# Patient Record
Sex: Male | Born: 1990 | Race: Black or African American | Hispanic: No | Marital: Single
Health system: Southern US, Community
[De-identification: ages and names within clinical notes are randomized; demographics above are authoritative.]

---
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
## 2017-08-08 ENCOUNTER — Other Ambulatory Visit: Payer: Self-pay

## 2017-08-08 ENCOUNTER — Emergency Department
Admission: EM | Admit: 2017-08-08 | Discharge: 2017-08-09 | Disposition: A | Discharge status: Home / Self Care | Payer: Self-pay | Attending: Emergency Medicine | Admitting: Emergency Medicine

## 2017-08-08 ENCOUNTER — Emergency Department: Payer: Self-pay

## 2017-08-08 ENCOUNTER — Encounter: Payer: Self-pay | Admitting: Emergency Medicine

## 2017-08-08 DIAGNOSIS — R112 Nausea with vomiting, unspecified: Secondary | ICD-10-CM

## 2017-08-08 DIAGNOSIS — R1084 Generalized abdominal pain: Secondary | ICD-10-CM

## 2017-08-08 DIAGNOSIS — Z79899 Other long term (current) drug therapy: Secondary | ICD-10-CM | POA: Insufficient documentation

## 2017-08-08 DIAGNOSIS — K529 Noninfective gastroenteritis and colitis, unspecified: Secondary | ICD-10-CM | POA: Insufficient documentation

## 2017-08-08 DIAGNOSIS — F17200 Nicotine dependence, unspecified, uncomplicated: Secondary | ICD-10-CM | POA: Insufficient documentation

## 2017-08-08 DIAGNOSIS — R197 Diarrhea, unspecified: Secondary | ICD-10-CM

## 2017-08-08 MED ORDER — FAMOTIDINE IN NACL 20-0.9 MG/50ML-% IV SOLN
20.0000 mg | Freq: Once | INTRAVENOUS | Status: AC
  Administered 2017-08-08: 20 mg via INTRAVENOUS
  Filled 2017-08-08: qty 50

## 2017-08-08 MED ORDER — ONDANSETRON HCL 4 MG/2ML IJ SOLN
4.0000 mg | Freq: Once | INTRAMUSCULAR | Status: AC
  Administered 2017-08-08: 4 mg via INTRAVENOUS
  Filled 2017-08-08: qty 2

## 2017-08-08 MED ORDER — IOPAMIDOL (ISOVUE-300) INJECTION 61%
30.0000 mL | Freq: Once | INTRAVENOUS | Status: AC | PRN
  Administered 2017-08-08: 30 mL via ORAL

## 2017-08-08 MED ORDER — IOPAMIDOL (ISOVUE-370) INJECTION 76%
100.0000 mL | Freq: Once | INTRAVENOUS | Status: AC | PRN
  Administered 2017-08-09: 100 mL via INTRAVENOUS

## 2017-08-08 MED ORDER — SODIUM CHLORIDE 0.9 % IV BOLUS
1000.0000 mL | Freq: Once | INTRAVENOUS | Status: AC
  Administered 2017-08-08: 1000 mL via INTRAVENOUS

## 2017-08-08 NOTE — ED Triage Notes (Addendum)
Pt to triage via w/c with no distress noted, no eye contact; pt c/o generalized abd pain x 2 days; denies any accomp symptoms; pt is refusing to answer questions in triage and only does so after much prompting

## 2017-08-08 NOTE — ED Provider Notes (Signed)
[facility] Emergency Department Provider Note   ____________________________________________   First MD Initiated Contact with Patient 08/08/17 2314     (approximate)  I have reviewed the triage vital signs and the nursing notes.   HISTORY  Chief Complaint Abdominal Pain  Vague historian; history obtained by family members  HPI Nicholas T Justiniano is a 27 y.o. male who presents to the ED from home with a chief complaint of abdominal pain, nausea/vomiting/diarrhea.  Family reports a 2-day history of the above symptoms.  Patient complaining of generalized abdominal pain, constant, associated with vomiting and diarrhea.  Denies associated fever, chills, chest pain, shortness of breath, dysuria.  Family member has noted patient taking Mucinex for sinus drainage and dry cough.  Denies recent travel, trauma or antibiotic use.   Past medical history None  There are no active problems to display for this patient.   Past surgical history None  Prior to Admission medications   Medication Sig Start Date End Date Taking? Authorizing Provider  ciprofloxacin (CIPRO) 500 MG tablet Take 1 tablet (500 mg total) by mouth 2 (two) times daily. 08/09/17   Irean Hong, MD  metroNIDAZOLE (FLAGYL) 500 MG tablet Take 1 tablet (500 mg total) by mouth 2 (two) times daily. 08/09/17   Irean Hong, MD  ondansetron (ZOFRAN ODT) 4 MG disintegrating tablet Take 1 tablet (4 mg total) by mouth every 8 (eight) hours as needed for nausea or vomiting. 08/09/17   Irean Hong, MD  oxyCODONE-acetaminophen (PERCOCET/ROXICET) 5-325 MG tablet Take 1 tablet by mouth every 4 (four) hours as needed for severe pain. 08/09/17   Irean Hong, MD    Allergies Patient has no known allergies.  No family history on file.  Social History Social History   Tobacco Use  . Smoking status: Current Every Day Smoker  . Smokeless tobacco: Never Used  Substance Use Topics  . Alcohol use: Not Currently  . Drug  use: Not on file    Review of Systems  Constitutional: No fever/chills Eyes: No visual changes. ENT: No sore throat. Cardiovascular: Denies chest pain. Respiratory: Denies shortness of breath. Gastrointestinal: Positive for abdominal pain, nausea, vomiting and diarrhea.  No constipation. Genitourinary: Negative for dysuria. Musculoskeletal: Negative for back pain. Skin: Negative for rash. Neurological: Negative for headaches, focal weakness or numbness.   ____________________________________________   PHYSICAL EXAM:  VITAL SIGNS: ED Triage Vitals [08/08/17 2309]  Enc Vitals Group     BP 137/68     Pulse Rate 85     Resp 18     Temp 98 F (36.7 C)     Temp Source Oral     SpO2 98 %     Weight 220 lb (99.8 kg)     Height 6\' 2"  (1.88 m)     Head Circumference      Peak Flow      Pain Score 10     Pain Loc      Pain Edu?      Excl. in GC?     Constitutional: Alert and oriented. Well appearing and in no acute distress.  Refusing to speak.  Averts eye contact.  Answers some questions.  Otherwise family member does the majority of the talking. Eyes: Conjunctivae are normal. PERRL. EOMI. Head: Atraumatic. Nose: No congestion/rhinnorhea. Mouth/Throat: Mucous membranes are moist.  Oropharynx non-erythematous. Neck: No stridor.   Cardiovascular: Normal rate, regular rhythm. Grossly normal heart sounds.  Good peripheral circulation. Respiratory: Normal respiratory effort.  No retractions. Lungs CTAB. Gastrointestinal: Soft and mildly tender to palpation diffusely without rebound or guarding. No distention. No abdominal bruits. No CVA tenderness. Musculoskeletal: No lower extremity tenderness nor edema.  No joint effusions. Neurologic:  Normal speech and language. No gross focal neurologic deficits are appreciated. No gait instability. Skin:  Skin is warm, dry and intact. No rash noted. Psychiatric: Mood and affect are normal. Speech and behavior are  normal.  ____________________________________________   LABS (all labs ordered are listed, but only abnormal results are displayed)  Labs Reviewed  CBC WITH DIFFERENTIAL/PLATELET - Abnormal; Notable for the following components:      Result Value   Neutro Abs 6.9 (*)    All other components within normal limits  COMPREHENSIVE METABOLIC PANEL - Abnormal; Notable for the following components:   Total Protein 8.2 (*)    AST 89 (*)    ALT 181 (*)    Alkaline Phosphatase 181 (*)    All other components within normal limits  URINALYSIS, COMPLETE (UACMP) WITH MICROSCOPIC - Abnormal; Notable for the following components:   Color, Urine YELLOW (*)    APPearance CLEAR (*)    Specific Gravity, Urine >1.046 (*)    All other components within normal limits  URINE DRUG SCREEN, QUALITATIVE ([facility] ONLY) - Abnormal; Notable for the following components:   Cannabinoid 50 Ng, Ur [place] POSITIVE (*)    All other components within normal limits  ETHANOL  LIPASE, BLOOD   ____________________________________________  EKG  None ____________________________________________  RADIOLOGY  ED MD interpretation: No acute cardiopulmonary process; mild wall thickening along distal ileum, bowel is distended with fluid, mildly dilated appendix without periappendiceal inflammation  Official radiology report(s): Dg Chest 2 View  Result Date: 08/09/2017 CLINICAL DATA:  Acute onset of generalized abdominal pain and cough. EXAM: CHEST - 2 VIEW COMPARISON:  Chest radiograph performed 03/08/2006 FINDINGS: The lungs are well-aerated and clear. There is no evidence of focal opacification, pleural effusion or pneumothorax. The heart is normal in size; the mediastinal contour is within normal limits. No acute osseous abnormalities are seen. IMPRESSION: No acute cardiopulmonary process seen. Electronically Signed   By: Roanna RaiderJeffery  Chang M.D.   On: 08/09/2017 00:45   Ct Abdomen Pelvis W Contrast  Result Date:  08/09/2017 CLINICAL DATA:  Acute onset of generalized abdominal pain. Nausea vomiting and diarrhea. EXAM: CT ABDOMEN AND PELVIS WITH CONTRAST TECHNIQUE: Multidetector CT imaging of the abdomen and pelvis was performed using the standard protocol following bolus administration of intravenous contrast. CONTRAST:  100mL ISOVUE-370 IOPAMIDOL (ISOVUE-370) INJECTION 76% COMPARISON:  None. FINDINGS: Lower chest: The visualized lung bases are grossly clear. The visualized portions of the mediastinum are unremarkable. Hepatobiliary: The liver is unremarkable in appearance. The gallbladder is unremarkable in appearance. The common bile duct remains normal in caliber. Pancreas: The pancreas is within normal limits. 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 Spleen: The spleen is unremarkable in appearance. Adrenals/Urinary Tract: The adrenal glands are unremarkable in appearance. The kidneys are within normal limits. There is no evidence of hydronephrosis. No renal or ureteral stones are identified. No perinephric stranding is seen. Stomach/Bowel: There is distention of the appendix to 9 mm, with minimal soft tissue inflammation. This is nonspecific, as the bowel is diffusely distended with fluid. Fluid within the colon corresponds to the patient's diarrhea. Focal narrowing at the distal sigmoid colon is thought to reflect transient decompression. Mild wall thickening is noted along the distal ileum, most prominent at the terminal ileum,  concerning for infectious or inflammatory ileitis. The stomach is unremarkable in appearance. Vascular/Lymphatic: The abdominal aorta is unremarkable in appearance. The inferior vena cava is grossly unremarkable. No retroperitoneal lymphadenopathy is seen. No pelvic sidewall lymphadenopathy is identified. Reproductive: The bladder is decompressed and not well characterized. The prostate is normal in size. Other: No additional soft tissue abnormalities are seen.  Musculoskeletal: No acute osseous abnormalities are identified. The visualized musculature is unremarkable in appearance. IMPRESSION: 1. Mild wall thickening along the distal ileum, most prominent at the terminal ileum, concerning for infectious or inflammatory ileitis. 2. Distention of the appendix to 9 mm, with minimal soft tissue inflammation. This is nonspecific, as the bowel is diffusely distended with fluid. Would correlate clinically for evidence of appendicitis. Electronically Signed   By: Roanna Raider M.D.   On: 08/09/2017 00:44    ____________________________________________   PROCEDURES  Procedure(s) performed: None  Procedures  Critical Care performed: No  ____________________________________________   INITIAL IMPRESSION / ASSESSMENT AND PLAN / ED COURSE  As part of my medical decision making, I reviewed the following data within the electronic MEDICAL RECORD NUMBER History obtained from family, Nursing notes reviewed and incorporated, Labs reviewed, Old chart reviewed, Radiograph reviewed  and Notes from prior ED visits   27 year old male who presents with generalized abdominal pain, nausea, vomiting and diarrhea. Differential diagnosis includes, but is not limited to, acute appendicitis, renal colic, testicular torsion, urinary tract infection/pyelonephritis, prostatitis,  epididymitis, diverticulitis, small bowel obstruction or ileus, colitis, abdominal aortic aneurysm, gastroenteritis, hernia, etc.   Will obtain screening lab work, chest x-ray for cough require Mucinex, CT abdomen/pelvis to evaluate for intra-abdominal etiology of patient's symptoms.  Will initiate IV fluids, 4 mg IV Zofran for nausea, 20 mg IV Pepcid and reassess.   Clinical Course as of Aug 10 602  Fri Aug 09, 2017  0131 Updated patient and family members of all test results.  Patient has had no further vomiting or diarrhea.  He has kept down the oral contrast.  Reexamination of abdomen is benign.   Patient does not have pain at McBurney's point; very low suspicion for appendicitis.  Noted CT scan finding of dilated appendix without inflammatory changes.  Patient complaining of itching to his IV site.  Arm is not red; there are no hives.  Both Cipro and Flagyl are running.  Nurse stopped the Cipro and now only Flagyl is running.  We will continue to monitor.   [JS]  0252 IV antibiotics completed.  Although patient is itching, there is no rash or urticaria.  There is no angioedema or difficulty breathing.  Room air saturations 98%.  Culprit is most likely Cipro.  Discussed with patient who states he would like to stay on Cipro/Flagyl and will take Benadryl with the Cipro versus switching to Augmentin on discharge.  Strict return precautions given.  Patient and family members verbalize understanding and agree with plan of care.   [JS]    Clinical Course User Index [JS] Irean Hong, MD     ____________________________________________   FINAL CLINICAL IMPRESSION(S) / ED DIAGNOSES  Final diagnoses:  Generalized abdominal pain  Nausea vomiting and diarrhea  Ileitis     ED Discharge Orders        Ordered    ciprofloxacin (CIPRO) 500 MG tablet  2 times daily     08/09/17 0227    metroNIDAZOLE (FLAGYL) 500 MG tablet  2 times daily     08/09/17 0227    oxyCODONE-acetaminophen (PERCOCET/ROXICET)  5-325 MG tablet  Every 4 hours PRN     08/09/17 0227    ondansetron (ZOFRAN ODT) 4 MG disintegrating tablet  Every 8 hours PRN     08/09/17 0227       Note:  This document was prepared using Dragon voice recognition software and may include unintentional dictation errors.    Irean Hong, MD 08/09/17 [phone]

## 2017-08-09 ENCOUNTER — Telehealth: Payer: Self-pay | Admitting: Gastroenterology

## 2017-08-09 LAB — CBC WITH DIFFERENTIAL/PLATELET
BASOS ABS: 0 10*3/uL (ref 0–0.1)
Basophils Relative: 0 %
Eosinophils Absolute: 0.1 10*3/uL (ref 0–0.7)
Eosinophils Relative: 1 %
HEMATOCRIT: 47.6 % (ref 40.0–52.0)
Hemoglobin: 16.2 g/dL (ref 13.0–18.0)
LYMPHS PCT: 14 %
Lymphs Abs: 1.2 10*3/uL (ref 1.0–3.6)
MCH: 29.9 pg (ref 26.0–34.0)
MCHC: 33.9 g/dL (ref 32.0–36.0)
MCV: 88.2 fL (ref 80.0–100.0)
MONO ABS: 0.6 10*3/uL (ref 0.2–1.0)
MONOS PCT: 7 %
NEUTROS ABS: 6.9 10*3/uL — AB (ref 1.4–6.5)
Neutrophils Relative %: 78 %
Platelets: 174 10*3/uL (ref 150–440)
RBC: 5.39 MIL/uL (ref 4.40–5.90)
RDW: 13 % (ref 11.5–14.5)
WBC: 8.8 10*3/uL (ref 3.8–10.6)

## 2017-08-09 LAB — COMPREHENSIVE METABOLIC PANEL
ALT: 181 U/L — ABNORMAL HIGH (ref 17–63)
AST: 89 U/L — AB (ref 15–41)
Albumin: 4.5 g/dL (ref 3.5–5.0)
Alkaline Phosphatase: 181 U/L — ABNORMAL HIGH (ref 38–126)
Anion gap: 12 (ref 5–15)
BUN: 15 mg/dL (ref 6–20)
CO2: 25 mmol/L (ref 22–32)
Calcium: 10.2 mg/dL (ref 8.9–10.3)
Chloride: 101 mmol/L (ref 101–111)
Creatinine, Ser: 0.88 mg/dL (ref 0.61–1.24)
GFR calc Af Amer: >60 mL/min (ref >60)
GFR calc non Af Amer: >60 mL/min (ref >60)
Glucose, Bld: 96 mg/dL (ref 65–99)
POTASSIUM: 3.9 mmol/L (ref 3.5–5.1)
Sodium: 138 mmol/L (ref 135–145)
Total Bilirubin: 1.2 mg/dL (ref 0.3–1.2)
Total Protein: 8.2 g/dL — ABNORMAL HIGH (ref 6.5–8.1)

## 2017-08-09 LAB — URINALYSIS, COMPLETE (UACMP) WITH MICROSCOPIC
BACTERIA UA: NONE SEEN
Bilirubin Urine: NEGATIVE
Glucose, UA: NEGATIVE mg/dL
HGB URINE DIPSTICK: NEGATIVE
Ketones, ur: NEGATIVE mg/dL
Leukocytes, UA: NEGATIVE
NITRITE: NEGATIVE
Protein, ur: NEGATIVE mg/dL
pH: 5 (ref 5.0–8.0)

## 2017-08-09 LAB — ETHANOL: Alcohol, Ethyl (B): <10 mg/dL (ref <10)

## 2017-08-09 LAB — URINE DRUG SCREEN, QUALITATIVE (ARMC ONLY)
Amphetamines, Ur Screen: NOT DETECTED
Barbiturates, Ur Screen: NOT DETECTED
Benzodiazepine, Ur Scrn: NOT DETECTED
COCAINE METABOLITE, UR ~~LOC~~: NOT DETECTED
Cannabinoid 50 Ng, Ur ~~LOC~~: POSITIVE — AB
MDMA (ECSTASY) UR SCREEN: NOT DETECTED
METHADONE SCREEN, URINE: NOT DETECTED
Opiate, Ur Screen: NOT DETECTED
Phencyclidine (PCP) Ur S: NOT DETECTED
TRICYCLIC, UR SCREEN: NOT DETECTED

## 2017-08-09 LAB — LIPASE, BLOOD: Lipase: 24 U/L (ref 11–51)

## 2017-08-09 MED ORDER — ONDANSETRON 4 MG PO TBDP: 4.0000 mg | ORAL_TABLET | Freq: Three times a day (TID) | ORAL | 0 refills | Status: AC | PRN

## 2017-08-09 MED ORDER — CIPROFLOXACIN HCL 500 MG PO TABS: 500.0000 mg | ORAL_TABLET | Freq: Two times a day (BID) | ORAL | 0 refills | Status: AC

## 2017-08-09 MED ORDER — DIPHENHYDRAMINE HCL 50 MG/ML IJ SOLN: 12.5000 mg | Freq: Once | INTRAMUSCULAR | Status: DC

## 2017-08-09 MED ORDER — DIPHENHYDRAMINE HCL 50 MG/ML IJ SOLN
INTRAMUSCULAR | Status: AC
  Filled 2017-08-09: qty 1

## 2017-08-09 MED ORDER — METRONIDAZOLE IN NACL 5-0.79 MG/ML-% IV SOLN
500.0000 mg | Freq: Once | INTRAVENOUS | Status: AC
Start: 2017-08-09 — End: 2017-08-09
  Administered 2017-08-09: 500 mg via INTRAVENOUS
  Filled 2017-08-09: qty 100

## 2017-08-09 MED ORDER — METRONIDAZOLE 500 MG PO TABS: 500.0000 mg | ORAL_TABLET | Freq: Two times a day (BID) | ORAL | 0 refills | Status: AC

## 2017-08-09 MED ORDER — OXYCODONE-ACETAMINOPHEN 5-325 MG PO TABS: 1.0000 | ORAL_TABLET | ORAL | 0 refills | Status: AC | PRN

## 2017-08-09 MED ORDER — DIPHENHYDRAMINE HCL 50 MG/ML IJ SOLN
12.5000 mg | Freq: Once | INTRAMUSCULAR | Status: AC
  Administered 2017-08-09: 12.5 mg via INTRAVENOUS
  Filled 2017-08-09: qty 1

## 2017-08-09 MED ORDER — CIPROFLOXACIN IN D5W 400 MG/200ML IV SOLN
400.0000 mg | Freq: Once | INTRAVENOUS | Status: AC
  Administered 2017-08-09: 400 mg via INTRAVENOUS
  Filled 2017-08-09: qty 200

## 2017-08-09 NOTE — ED Notes (Signed)
Cipro paused. Pt c/o itching at site. Dr Dolores FrameSung at bedside

## 2017-08-09 NOTE — Discharge Instructions (Signed)
1.  Take antibiotics as prescribed (Cipro/Flagyl 500 mg twice daily for 7 days). 2.  You may take pain and nausea medicines as needed (Percocet/Zofran #15). 3.  Clear liquids for the next 12 hours, then BRAT diet for 3 days, then slowly advance diet as tolerated.  Avoid heavy, greasy, spicy foods and alcohol. 4.  Return to the ER for worsening symptoms, persistent vomiting, fever, pain in your right lower abdomen, or other concerns.

## 2017-08-09 NOTE — Telephone Encounter (Signed)
ATTEMPTED TO CALL PT TO SCHEDULE ED FU PER NOTE  NO VM SET UP

## 2017-08-12 ENCOUNTER — Encounter: Payer: Self-pay | Admitting: Gastroenterology

## 2017-09-27 ENCOUNTER — Ambulatory Visit: Payer: Self-pay | Admitting: Gastroenterology

## 2017-09-27 ENCOUNTER — Encounter: Payer: Self-pay | Admitting: Gastroenterology

## 2018-04-09 ENCOUNTER — Emergency Department
Admission: EM | Admit: 2018-04-09 | Discharge: 2018-04-09 | Disposition: A | Discharge status: Home / Self Care | Payer: Self-pay | Attending: Emergency Medicine | Admitting: Emergency Medicine

## 2018-04-09 ENCOUNTER — Emergency Department: Payer: Self-pay

## 2018-04-09 DIAGNOSIS — R748 Abnormal levels of other serum enzymes: Secondary | ICD-10-CM | POA: Insufficient documentation

## 2018-04-09 DIAGNOSIS — R112 Nausea with vomiting, unspecified: Secondary | ICD-10-CM | POA: Insufficient documentation

## 2018-04-09 DIAGNOSIS — R197 Diarrhea, unspecified: Secondary | ICD-10-CM | POA: Insufficient documentation

## 2018-04-09 DIAGNOSIS — R945 Abnormal results of liver function studies: Secondary | ICD-10-CM

## 2018-04-09 DIAGNOSIS — F17200 Nicotine dependence, unspecified, uncomplicated: Secondary | ICD-10-CM | POA: Insufficient documentation

## 2018-04-09 DIAGNOSIS — K529 Noninfective gastroenteritis and colitis, unspecified: Secondary | ICD-10-CM

## 2018-04-09 DIAGNOSIS — R102 Pelvic and perineal pain: Secondary | ICD-10-CM

## 2018-04-09 DIAGNOSIS — R7989 Other specified abnormal findings of blood chemistry: Secondary | ICD-10-CM

## 2018-04-09 DIAGNOSIS — R1030 Lower abdominal pain, unspecified: Secondary | ICD-10-CM | POA: Insufficient documentation

## 2018-04-09 LAB — CBC
HCT: 42.6 % (ref 39.0–52.0)
HEMOGLOBIN: 14.9 g/dL (ref 13.0–17.0)
MCH: 29.3 pg (ref 26.0–34.0)
MCHC: 35 g/dL (ref 30.0–36.0)
MCV: 83.7 fL (ref 80.0–100.0)
NRBC: 0 % (ref 0.0–0.2)
PLATELETS: 197 10*3/uL (ref 150–400)
RBC: 5.09 MIL/uL (ref 4.22–5.81)
RDW: 12.1 % (ref 11.5–15.5)
WBC: 5.8 10*3/uL (ref 4.0–10.5)

## 2018-04-09 LAB — COMPREHENSIVE METABOLIC PANEL
ALBUMIN: 4.3 g/dL (ref 3.5–5.0)
ALT: 174 U/L — ABNORMAL HIGH (ref 0–44)
AST: 57 U/L — ABNORMAL HIGH (ref 15–41)
Alkaline Phosphatase: 154 U/L — ABNORMAL HIGH (ref 38–126)
Anion gap: 11 (ref 5–15)
BUN: 11 mg/dL (ref 6–20)
CALCIUM: 9.6 mg/dL (ref 8.9–10.3)
CHLORIDE: 100 mmol/L (ref 98–111)
CO2: 25 mmol/L (ref 22–32)
CREATININE: 0.85 mg/dL (ref 0.61–1.24)
GFR calc non Af Amer: >60 mL/min (ref >60)
Glucose, Bld: 92 mg/dL (ref 70–99)
Potassium: 3.6 mmol/L (ref 3.5–5.1)
SODIUM: 136 mmol/L (ref 135–145)
TOTAL PROTEIN: 7.4 g/dL (ref 6.5–8.1)
Total Bilirubin: 1.3 mg/dL — ABNORMAL HIGH (ref 0.3–1.2)

## 2018-04-09 LAB — URINALYSIS, COMPLETE (UACMP) WITH MICROSCOPIC
Bacteria, UA: NONE SEEN
Bilirubin Urine: NEGATIVE
GLUCOSE, UA: NEGATIVE mg/dL
HGB URINE DIPSTICK: NEGATIVE
KETONES UR: 80 mg/dL — AB
LEUKOCYTES UA: NEGATIVE
NITRITE: NEGATIVE
PH: 6 (ref 5.0–8.0)
PROTEIN: NEGATIVE mg/dL
Specific Gravity, Urine: 1.026 (ref 1.005–1.030)

## 2018-04-09 LAB — LIPASE, BLOOD: LIPASE: 33 U/L (ref 11–51)

## 2018-04-09 MED ORDER — ONDANSETRON HCL 4 MG/2ML IJ SOLN
4.0000 mg | Freq: Once | INTRAMUSCULAR | Status: AC
  Administered 2018-04-09: 4 mg via INTRAVENOUS
  Filled 2018-04-09: qty 2

## 2018-04-09 MED ORDER — SODIUM CHLORIDE 0.9 % IV BOLUS
1000.0000 mL | Freq: Once | INTRAVENOUS | Status: AC
  Administered 2018-04-09: 1000 mL via INTRAVENOUS

## 2018-04-09 MED ORDER — LOPERAMIDE HCL 2 MG PO TABS: 2.0000 mg | ORAL_TABLET | Freq: Four times a day (QID) | ORAL | 0 refills | Status: AC | PRN

## 2018-04-09 MED ORDER — IOPAMIDOL (ISOVUE-300) INJECTION 61%
100.0000 mL | Freq: Once | INTRAVENOUS | Status: AC | PRN
  Administered 2018-04-09: 100 mL via INTRAVENOUS
  Filled 2018-04-09: qty 100

## 2018-04-09 MED ORDER — KETOROLAC TROMETHAMINE 30 MG/ML IJ SOLN
30.0000 mg | Freq: Once | INTRAMUSCULAR | Status: AC
  Administered 2018-04-09: 30 mg via INTRAVENOUS
  Filled 2018-04-09: qty 1

## 2018-04-09 NOTE — ED Triage Notes (Signed)
C/o lower middle abd pain. Male with pt states he has had this before. States "bacterial infection in stomach and colitis before." N&V&D last night. Denies blood in vomit/diarrhea. Denies urinary symptoms. Pt crying in triage. Still has abd organs.

## 2018-04-09 NOTE — ED Notes (Signed)
Pt refuses to sign for discharge instruction.
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
## 2018-04-09 NOTE — Discharge Instructions (Addendum)
Please avoid Tylenol and alcohol, as this can worsen your liver function test.    Loperamide is for diarrhea.  Do not use this medication for more than 3 days.  Please make an appointment with a primary care physician, as well as a gastrointestinal specialist for reevaluation of your abdominal pain, as well as your abnormal CT in June.  Also, your liver function tests have been abnormal for some time and this needs to be worked up either by the primary care physician or your GI doctor.  Return to the emergency department if you develop severe pain, lightheadedness or fainting, inability to keep down fluids, fever, or any other symptoms concerning to you.

## 2021-01-28 ENCOUNTER — Emergency Department
Admission: EM | Admit: 2021-01-28 | Discharge: 2021-01-28 | Disposition: A | Discharge status: Home / Self Care | Payer: Self-pay | Attending: Emergency Medicine | Admitting: Emergency Medicine

## 2021-01-28 ENCOUNTER — Emergency Department: Payer: Self-pay

## 2021-01-28 DIAGNOSIS — Y248XXA Other firearm discharge, undetermined intent, initial encounter: Secondary | ICD-10-CM | POA: Insufficient documentation

## 2021-01-28 DIAGNOSIS — Z23 Encounter for immunization: Secondary | ICD-10-CM | POA: Insufficient documentation

## 2021-01-28 DIAGNOSIS — S72301A Unspecified fracture of shaft of right femur, initial encounter for closed fracture: Secondary | ICD-10-CM | POA: Insufficient documentation

## 2021-01-28 DIAGNOSIS — W3400XA Accidental discharge from unspecified firearms or gun, initial encounter: Secondary | ICD-10-CM

## 2021-01-28 DIAGNOSIS — I10 Essential (primary) hypertension: Secondary | ICD-10-CM | POA: Insufficient documentation

## 2021-01-28 DIAGNOSIS — R000 Tachycardia, unspecified: Secondary | ICD-10-CM | POA: Insufficient documentation

## 2021-01-28 DIAGNOSIS — S82891A Other fracture of right lower leg, initial encounter for closed fracture: Secondary | ICD-10-CM | POA: Insufficient documentation

## 2021-01-28 LAB — CBC WITH DIFFERENTIAL/PLATELET
Abs Immature Granulocytes: 0.02 10*3/uL (ref 0.00–0.07)
Basophils Absolute: 0 10*3/uL (ref 0.0–0.1)
Basophils Relative: 0 %
Eosinophils Absolute: 0.2 10*3/uL (ref 0.0–0.5)
Eosinophils Relative: 2 %
HCT: 40.1 % (ref 39.0–52.0)
Hemoglobin: 14.2 g/dL (ref 13.0–17.0)
Immature Granulocytes: 0 %
Lymphocytes Relative: 39 %
Lymphs Abs: 3.4 10*3/uL (ref 0.7–4.0)
MCH: 30.7 pg (ref 26.0–34.0)
MCHC: 35.4 g/dL (ref 30.0–36.0)
MCV: 86.8 fL (ref 80.0–100.0)
Monocytes Absolute: 0.8 10*3/uL (ref 0.1–1.0)
Monocytes Relative: 10 %
Neutro Abs: 4.3 10*3/uL (ref 1.7–7.7)
Neutrophils Relative %: 49 %
Platelets: 295 10*3/uL (ref 150–400)
RBC: 4.62 MIL/uL (ref 4.22–5.81)
RDW: 12.4 % (ref 11.5–15.5)
WBC: 8.8 10*3/uL (ref 4.0–10.5)
nRBC: 0 % (ref 0.0–0.2)

## 2021-01-28 LAB — PROTIME-INR
INR: 1 (ref 0.8–1.2)
Prothrombin Time: 13.2 seconds (ref 11.4–15.2)

## 2021-01-28 LAB — BASIC METABOLIC PANEL
Anion gap: 11 (ref 5–15)
BUN: 13 mg/dL (ref 6–20)
CO2: 27 mmol/L (ref 22–32)
Calcium: 9 mg/dL (ref 8.9–10.3)
Chloride: 100 mmol/L (ref 98–111)
Creatinine, Ser: 0.86 mg/dL (ref 0.61–1.24)
GFR, Estimated: 58 mL/min — ABNORMAL LOW (ref >60)
Glucose, Bld: 133 mg/dL — ABNORMAL HIGH (ref 70–99)
Potassium: 3 mmol/L — ABNORMAL LOW (ref 3.5–5.1)
Sodium: 138 mmol/L (ref 135–145)

## 2021-01-28 LAB — ETHANOL: Alcohol, Ethyl (B): 170 mg/dL — ABNORMAL HIGH (ref <10)

## 2021-01-28 LAB — TYPE AND SCREEN
ABO/RH(D): O POS
Antibody Screen: NEGATIVE

## 2021-01-28 LAB — APTT: aPTT: 28 seconds (ref 24–36)

## 2021-01-28 MED ORDER — HYDROMORPHONE HCL 1 MG/ML IJ SOLN: 1.0000 mg | Freq: Once | INTRAMUSCULAR | Status: DC

## 2021-01-28 MED ORDER — CEPHALEXIN 500 MG PO CAPS: 500.0000 mg | ORAL_CAPSULE | Freq: Three times a day (TID) | ORAL | 0 refills | Status: AC

## 2021-01-28 MED ORDER — ONDANSETRON HCL 4 MG/2ML IJ SOLN
4.0000 mg | Freq: Once | INTRAMUSCULAR | Status: AC
  Administered 2021-01-28: 4 mg via INTRAVENOUS
  Filled 2021-01-28: qty 2

## 2021-01-28 MED ORDER — MORPHINE SULFATE (PF) 4 MG/ML IV SOLN
4.0000 mg | Freq: Once | INTRAVENOUS | Status: AC
  Administered 2021-01-28: 4 mg via INTRAVENOUS
  Filled 2021-01-28: qty 1

## 2021-01-28 MED ORDER — OXYCODONE-ACETAMINOPHEN 5-325 MG PO TABS: 1.0000 | ORAL_TABLET | ORAL | 0 refills | Status: AC | PRN

## 2021-01-28 MED ORDER — TETANUS-DIPHTH-ACELL PERTUSSIS 5-2.5-18.5 LF-MCG/0.5 IM SUSY
0.5000 mL | PREFILLED_SYRINGE | Freq: Once | INTRAMUSCULAR | Status: AC
  Administered 2021-01-28: 0.5 mL via INTRAMUSCULAR
  Filled 2021-01-28: qty 0.5

## 2021-01-28 MED ORDER — CEFAZOLIN SODIUM-DEXTROSE 2-4 GM/100ML-% IV SOLN
2.0000 g | Freq: Once | INTRAVENOUS | Status: AC
  Administered 2021-01-28: 2 g via INTRAVENOUS
  Filled 2021-01-28: qty 100

## 2021-01-28 MED ORDER — IOHEXOL 350 MG/ML SOLN
125.0000 mL | Freq: Once | INTRAVENOUS | Status: AC | PRN
  Administered 2021-01-28: 125 mL via INTRAVENOUS

## 2021-01-28 MED ORDER — OXYCODONE HCL 5 MG PO TABS
5.0000 mg | ORAL_TABLET | Freq: Once | ORAL | Status: AC
  Administered 2021-01-28: 5 mg via ORAL
  Filled 2021-01-28: qty 1

## 2021-01-28 MED ORDER — ACETAMINOPHEN 500 MG PO TABS
1000.0000 mg | ORAL_TABLET | Freq: Once | ORAL | Status: AC
  Administered 2021-01-28: 1000 mg via ORAL
  Filled 2021-01-28: qty 2
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
